# Patient Record
Sex: Female | Born: 1935
Health system: Southern US, Community
[De-identification: ages and names within clinical notes are randomized; demographics above are authoritative.]

## PROBLEM LIST (undated history)

## (undated) DIAGNOSIS — F32A Depression, unspecified: Secondary | ICD-10-CM

## (undated) DIAGNOSIS — C801 Malignant (primary) neoplasm, unspecified: Secondary | ICD-10-CM

## (undated) DIAGNOSIS — F329 Major depressive disorder, single episode, unspecified: Secondary | ICD-10-CM

## (undated) DIAGNOSIS — K219 Gastro-esophageal reflux disease without esophagitis: Secondary | ICD-10-CM

## (undated) DIAGNOSIS — I10 Essential (primary) hypertension: Secondary | ICD-10-CM

## (undated) DIAGNOSIS — E119 Type 2 diabetes mellitus without complications: Secondary | ICD-10-CM

---

## 2014-10-07 ENCOUNTER — Encounter (HOSPITAL_COMMUNITY): Payer: Self-pay | Admitting: Emergency Medicine

## 2014-10-07 ENCOUNTER — Emergency Department (HOSPITAL_COMMUNITY): Payer: 59

## 2014-10-07 ENCOUNTER — Emergency Department (HOSPITAL_COMMUNITY)
Admission: EM | Admit: 2014-10-07 | Discharge: 2014-10-07 | Disposition: A | Discharge status: Home / Self Care | Payer: 59 | Attending: Emergency Medicine | Admitting: Emergency Medicine

## 2014-10-07 DIAGNOSIS — K219 Gastro-esophageal reflux disease without esophagitis: Secondary | ICD-10-CM | POA: Insufficient documentation

## 2014-10-07 DIAGNOSIS — Y998 Other external cause status: Secondary | ICD-10-CM | POA: Diagnosis not present

## 2014-10-07 DIAGNOSIS — W01198A Fall on same level from slipping, tripping and stumbling with subsequent striking against other object, initial encounter: Secondary | ICD-10-CM | POA: Insufficient documentation

## 2014-10-07 DIAGNOSIS — Y92128 Other place in nursing home as the place of occurrence of the external cause: Secondary | ICD-10-CM | POA: Insufficient documentation

## 2014-10-07 DIAGNOSIS — Z859 Personal history of malignant neoplasm, unspecified: Secondary | ICD-10-CM | POA: Diagnosis not present

## 2014-10-07 DIAGNOSIS — S2232XA Fracture of one rib, left side, initial encounter for closed fracture: Secondary | ICD-10-CM | POA: Insufficient documentation

## 2014-10-07 DIAGNOSIS — E119 Type 2 diabetes mellitus without complications: Secondary | ICD-10-CM | POA: Insufficient documentation

## 2014-10-07 DIAGNOSIS — I10 Essential (primary) hypertension: Secondary | ICD-10-CM | POA: Insufficient documentation

## 2014-10-07 DIAGNOSIS — Z79899 Other long term (current) drug therapy: Secondary | ICD-10-CM | POA: Insufficient documentation

## 2014-10-07 DIAGNOSIS — F329 Major depressive disorder, single episode, unspecified: Secondary | ICD-10-CM | POA: Diagnosis not present

## 2014-10-07 DIAGNOSIS — Y9389 Activity, other specified: Secondary | ICD-10-CM | POA: Diagnosis not present

## 2014-10-07 DIAGNOSIS — S299XXA Unspecified injury of thorax, initial encounter: Secondary | ICD-10-CM | POA: Diagnosis present

## 2014-10-07 HISTORY — DX: Major depressive disorder, single episode, unspecified: F32.9

## 2014-10-07 HISTORY — DX: Gastro-esophageal reflux disease without esophagitis: K21.9

## 2014-10-07 HISTORY — DX: Malignant (primary) neoplasm, unspecified: C80.1

## 2014-10-07 HISTORY — DX: Depression, unspecified: F32.A

## 2014-10-07 HISTORY — DX: Type 2 diabetes mellitus without complications: E11.9

## 2014-10-07 HISTORY — DX: Essential (primary) hypertension: I10

## 2014-10-07 LAB — CBG MONITORING, ED: GLUCOSE-CAPILLARY: 119 mg/dL — AB (ref 65–99)

## 2014-10-07 MED ORDER — HYDROCODONE-ACETAMINOPHEN 5-325 MG PO TABS: 1.0000 | ORAL_TABLET | ORAL | Status: AC | PRN

## 2014-10-07 MED ORDER — DOCUSATE SODIUM 100 MG PO CAPS: 100.0000 mg | ORAL_CAPSULE | Freq: Two times a day (BID) | ORAL | Status: AC

## 2014-10-07 MED ORDER — HYDROCODONE-ACETAMINOPHEN 5-325 MG PO TABS
1.0000 | ORAL_TABLET | ORAL | Status: AC
Start: 2014-10-07 — End: 2014-10-07
  Administered 2014-10-07: 1 via ORAL
  Filled 2014-10-07: qty 1

## 2014-10-07 NOTE — Progress Notes (Signed)
CSW met with pt at bedside. There was no family present. Patient is hard of hearing. Patient confirms that he is from [place].   Patient states that she fell yesterday. Patient states that she fell due to trying to pull her pants up. Patient stated " I was reaching to pull my pads up." Patient states that she was using the bathroom.  Patient states that she receives assistance completing  ADL's. Also, pt informed CSW that her sister is her primary support. She states that her sister lives in [place], [place] reached out to sister upon patient's request to inform her that she is at [facility].  Sister/Gray [phone]  Willette Brace [phone] ED CSW 10/07/2014 5:05 PM

## 2014-10-07 NOTE — ED Notes (Signed)
Called report to [place] at the nursing care facility and called [facility] for transport.

## 2014-10-07 NOTE — ED Notes (Signed)
Bed: WA08 Expected date:  Expected time:  Means of arrival:  Comments: Ems- 81 yo fall from wheelchair

## 2014-10-07 NOTE — Discharge Instructions (Signed)

## 2014-10-07 NOTE — ED Provider Notes (Signed)
CSN: [id number]     Arrival date & time 10/07/14  1257 History   First MD Initiated Contact with Patient 10/07/14 1301     Chief Complaint  Patient presents with  . Fall  . Rib Injury    HPI Pt was attempting to stand up when she fell injuring her left ribs.   Pt states this occurred yesterday in the bathroom.  She was attempting to pull her pants up and lost her balance when bending over and hit her ribs on either the wheelchair or a table in the left rib area.  Since then she has had some pain with breathing.  No shortness of breath.  No abdominal pain.  Past Medical History  Diagnosis Date  . Hypertension   . Diabetes mellitus without complication   . GERD (gastroesophageal reflux disease)   . Cancer   . Depression    History reviewed. No pertinent past surgical history. History reviewed. No pertinent family history. History  Substance Use Topics  . Smoking status: Not on file  . Smokeless tobacco: Not on file  . Alcohol Use: Not on file   OB History    No data available     Review of Systems  All other systems reviewed and are negative.     Allergies  Codeine and Sulfa antibiotics  Home Medications   Prior to Admission medications   Medication Sig Start Date End Date Taking? Authorizing Provider  acetaminophen (TYLENOL) 325 MG tablet Take 650 mg by mouth every 4 (four) hours as needed for mild pain or moderate pain.   Yes Historical Provider, MD  alum & mag hydroxide-simeth (MI-ACID) 200-200-20 MG/5ML suspension Take 30 mLs by mouth every 4 (four) hours as needed (constipation *per mar*).   Yes Historical Provider, MD  citalopram (CELEXA) 20 MG tablet Take 20 mg by mouth daily.   Yes Historical Provider, MD  CRANBERRY-VITAMIN C PO Take 1 capsule by mouth 2 (two) times daily.   Yes Historical Provider, MD  esomeprazole (NEXIUM) 40 MG capsule Take 40 mg by mouth daily at 12 noon.   Yes Historical Provider, MD  ferrous sulfate 325 (65 FE) MG tablet Take 325 mg by  mouth daily.   Yes Historical Provider, MD  glipiZIDE (GLUCOTROL XL) 5 MG 24 hr tablet Take 5 mg by mouth daily.   Yes Historical Provider, MD  LORazepam (ATIVAN) 0.5 MG tablet Take 0.5 mg by mouth every 12 (twelve) hours as needed for anxiety.   Yes Historical Provider, MD  magnesium hydroxide (MILK OF MAGNESIA) 400 MG/5ML suspension Take 30 mLs by mouth daily as needed for mild constipation.   Yes Historical Provider, MD  Polyethyl Glycol-Propyl Glycol (LUBRICANT EYE DROPS) 0.4-0.3 % SOLN Apply 2 drops to eye 2 (two) times daily.   Yes Historical Provider, MD  polyethylene glycol (MIRALAX / GLYCOLAX) packet Take 17 g by mouth daily. Mix 1 capful in 8 oz. Of fluid   Yes Historical Provider, MD  primidone (MYSOLINE) 250 MG tablet Take 250 mg by mouth at bedtime.   Yes Historical Provider, MD  psyllium (REGULOID) 0.52 G capsule Take 0.52 g by mouth daily.   Yes Historical Provider, MD  senna-docusate (SENOKOT-S) 8.6-50 MG per tablet Take 2 tablets by mouth at bedtime.   Yes Historical Provider, MD  triamcinolone (KENALOG) 0.025 % cream Apply 1 application topically 2 (two) times daily.   Yes Historical Provider, MD  Vitamin D, Cholecalciferol, 1000 UNITS CAPS Take 2,000 Units by mouth daily.  Yes Historical Provider, MD  docusate sodium (COLACE) 100 MG capsule Take 1 capsule (100 mg total) by mouth every 12 (twelve) hours. 10/07/14   Dorie Rank, MD  HYDROcodone-acetaminophen (NORCO/VICODIN) 5-325 MG per tablet Take 1 tablet by mouth every 4 (four) hours as needed for moderate pain. 10/07/14   Dorie Rank, MD   BP 159/90 mmHg  Pulse 77  Temp(Src) 97.9 F (36.6 C) (Oral)  Resp 18  SpO2 97% Physical Exam  Constitutional: She appears well-developed and well-nourished. No distress.  HENT:  Head: Normocephalic and atraumatic.  Right Ear: External ear normal.  Left Ear: External ear normal.  Eyes: Conjunctivae are normal. Right eye exhibits no discharge. Left eye exhibits no discharge. No scleral  icterus.  Neck: Neck supple. No tracheal deviation present.  Cardiovascular: Normal rate, regular rhythm and intact distal pulses.   Pulmonary/Chest: Effort normal and breath sounds normal. No stridor. No respiratory distress. She has no wheezes. She has no rales. She exhibits tenderness and bony tenderness. She exhibits no crepitus, no edema and no retraction.    Abdominal: Soft. Bowel sounds are normal. She exhibits no distension. There is no tenderness. There is no rebound and no guarding.  Musculoskeletal: She exhibits no edema or tenderness.  Neurological: She is alert. She has normal strength. No cranial nerve deficit (no facial droop, extraocular movements intact, no slurred speech) or sensory deficit. She exhibits normal muscle tone. She displays no seizure activity. Coordination normal.  Skin: Skin is warm and dry. No rash noted.  Psychiatric: She has a normal mood and affect.  Nursing note and vitals reviewed.   ED Course  Procedures (including critical care time) Labs Review Labs Reviewed  CBG MONITORING, ED - Abnormal; Notable for the following:    Glucose-Capillary 119 (*)    All other components within normal limits    Imaging Review Dg Ribs Unilateral W/chest Left  10/07/2014   CLINICAL DATA:  Fall trying to get off the toilet, now with left lower anterior and axillary rib pain.  EXAM: LEFT RIBS AND CHEST - 3+ VIEW  COMPARISON:  None.  FINDINGS: Normal cardiac silhouette and mediastinal contours with atherosclerotic plaque within the thoracic aorta. The lungs appear mildly hyperexpanded. Post bilateral breast augmentation. No discrete focal airspace opacities. No pleural effusion pneumothorax. No evidence of edema.  There is a minimally displaced fracture involving the lateral aspect of the left approximate seventh rib. Regional soft tissues appear normal. No radiopaque foreign body.  IMPRESSION: Suspected minimally displaced fracture involving the lateral aspect of the left  approximate seventh rib without associated pneumothorax, pleural effusion or radiopaque foreign body.   Electronically Signed   By: Sandi Mariscal M.D.   On: 10/07/2014 14:43     MDM   Final diagnoses:  Rib fracture, left, closed, initial encounter    Mechanical fall. Isolated rib fracture.  Pain improved with oral pain medications.  Will dc home with hydrocodone.  Rx colace to help with constipation.  At this time there does not appear to be any evidence of an acute emergency medical condition and the patient appears stable for discharge with appropriate outpatient follow up.     Dorie Rank, MD 10/07/14 1524

## 2014-10-07 NOTE — ED Notes (Addendum)
Pt was pulling up pants and fell back. Hit left ribs on wheelchair on the way down. Per EMS when pt is still she states she's not in pain, only when she moves or takes a deep breath is she in pain. From [place] off [address]

## 2014-10-09 NOTE — ED Notes (Signed)
Opened chart to answer question regarding prescription given

## 2015-08-18 DEATH — deceased

## 2017-02-20 IMAGING — CR DG RIBS W/ CHEST 3+V*L*
5 series · 5 of 5 positions shown · non-contrast
Comparison: None.

CLINICAL DATA: Fall trying to get off the toilet, now with left
lower anterior and axillary rib pain.

EXAM:
LEFT RIBS AND CHEST - 3+ VIEW

[x chest ap]
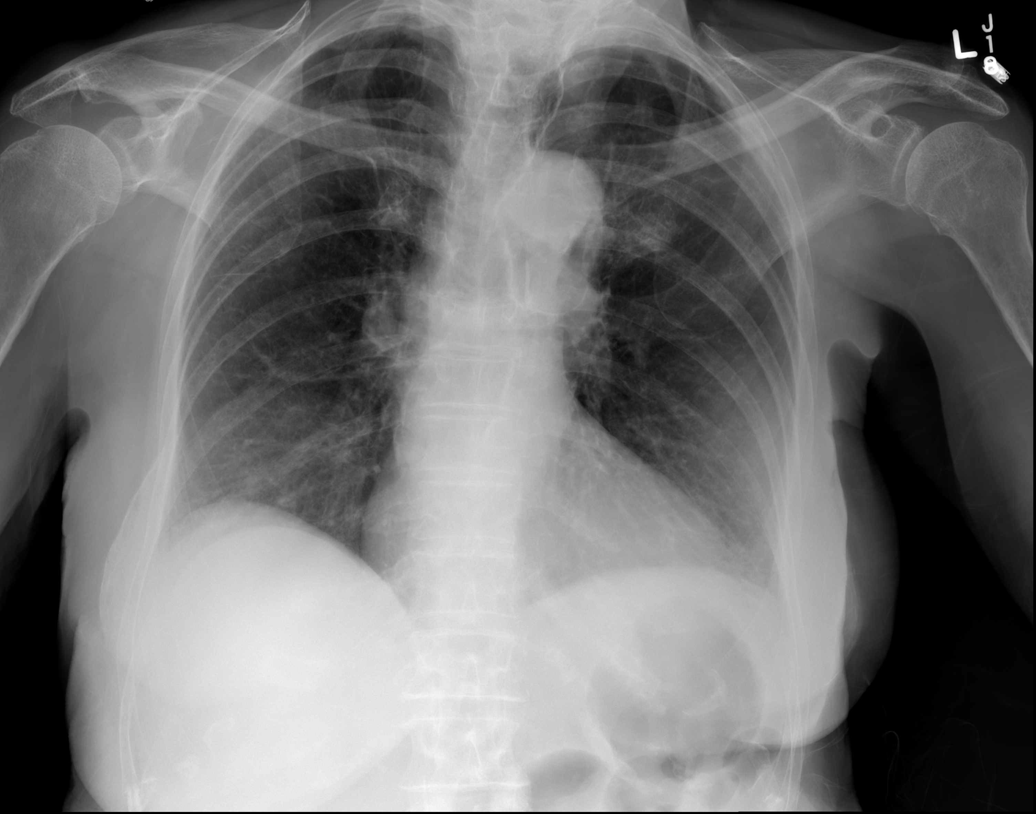

[t ribs ap upper left (1 of 2)]
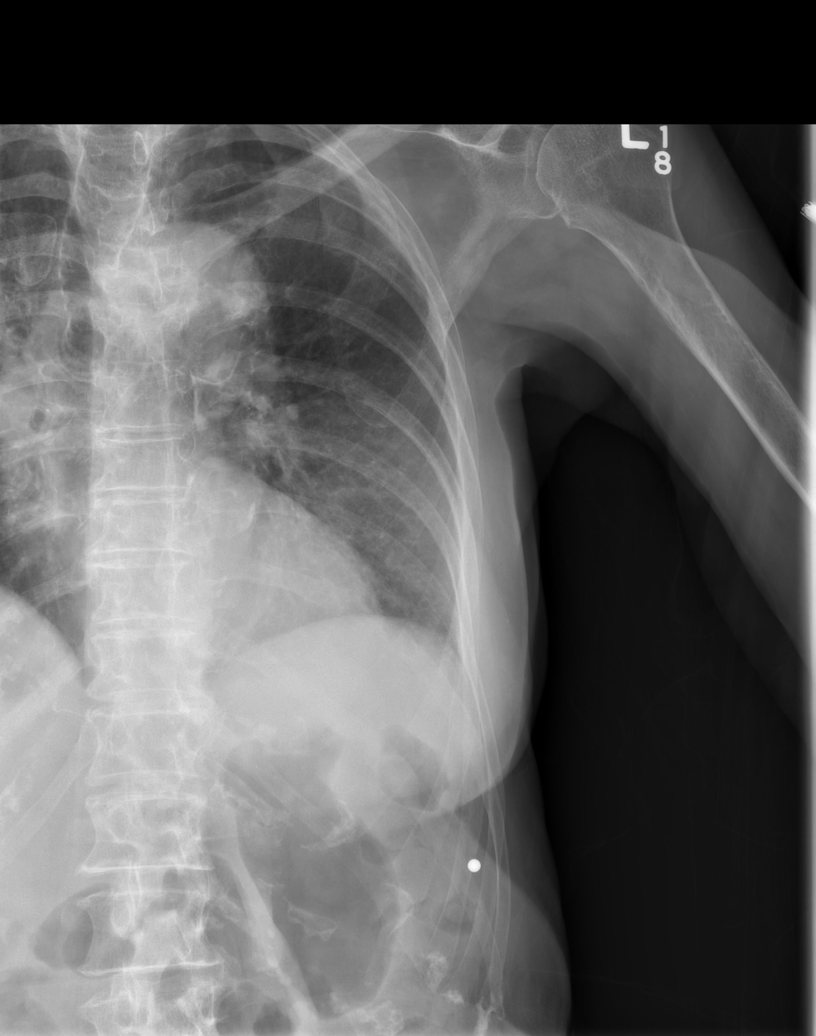

[t ribs ap lower left (1 of 2)]
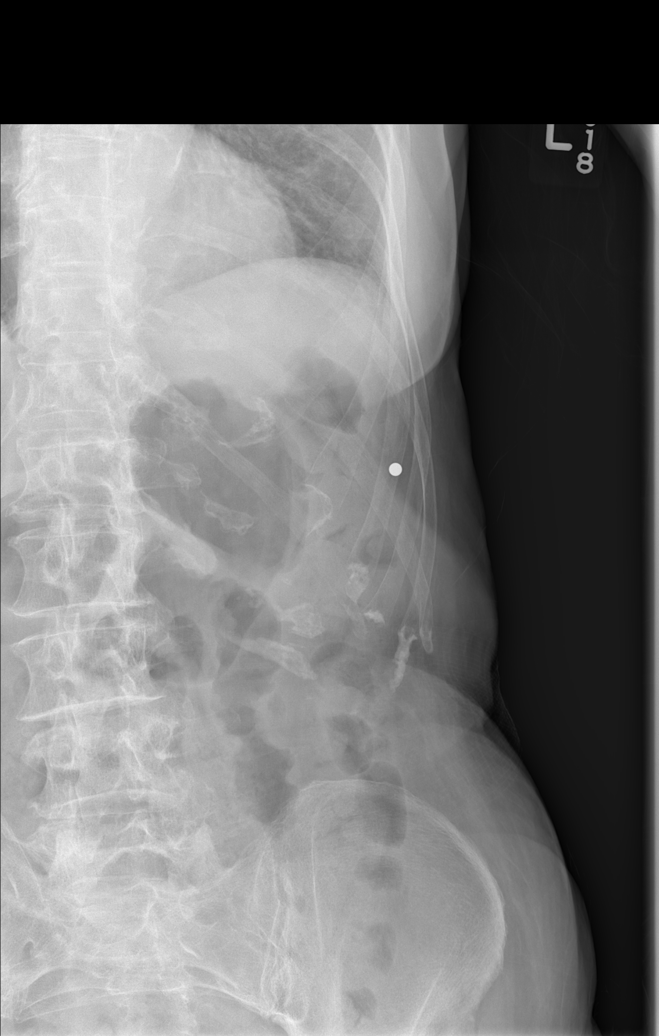

[t ribs ap upper left (2 of 2)]
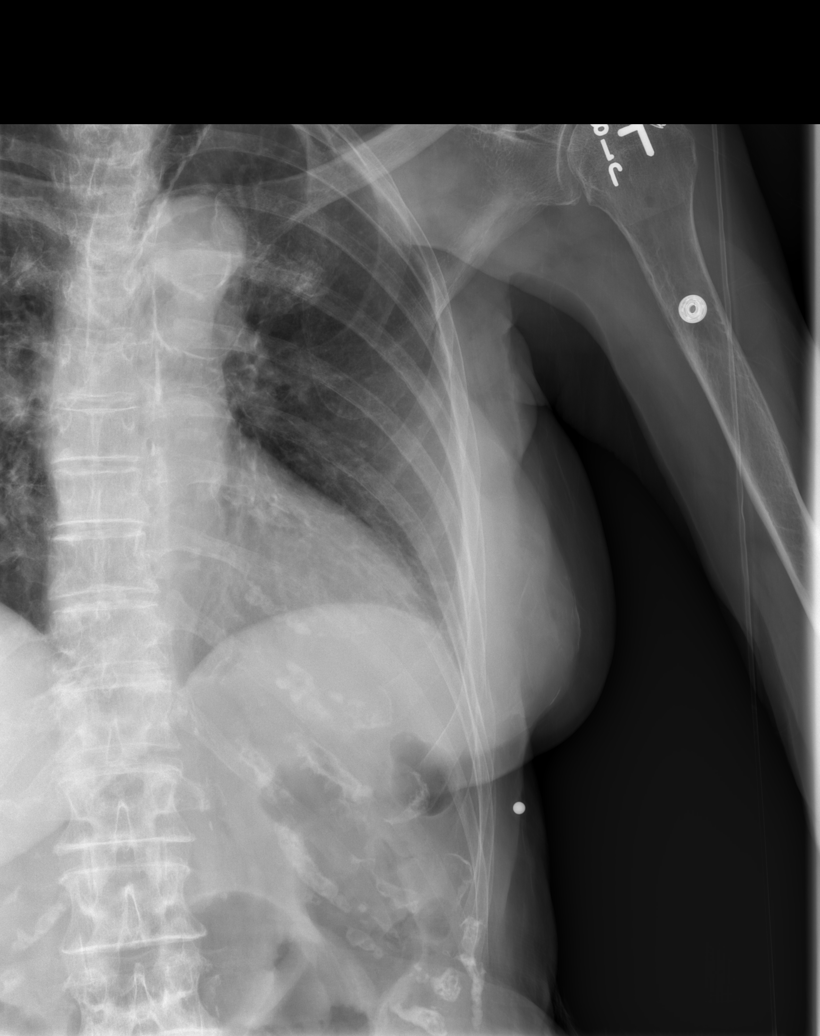

[t ribs ap lower left (2 of 2)]
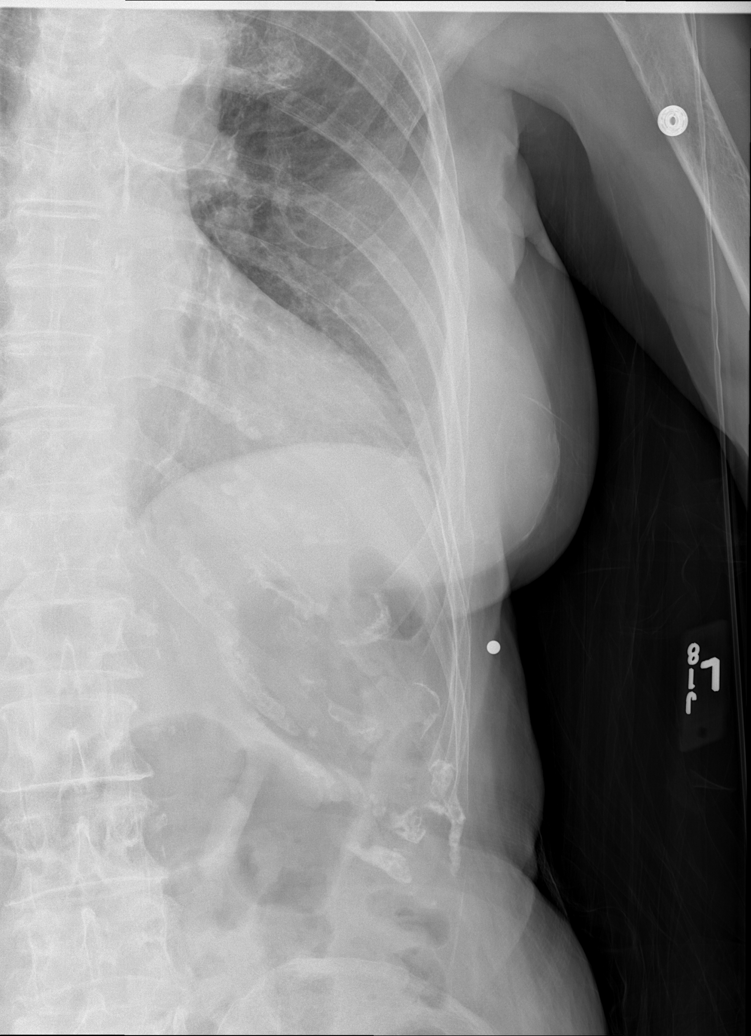

[5 of 5 positions shown; findings below may reference images not displayed]

FINDINGS: Normal cardiac silhouette and mediastinal contours with
atherosclerotic plaque within the thoracic aorta. The lungs appear
mildly hyperexpanded. Post bilateral breast augmentation. No
discrete focal airspace opacities. No pleural effusion pneumothorax.
No evidence of edema.

There is a minimally displaced fracture involving the lateral aspect
of the left approximate seventh rib. Regional soft tissues appear
normal. No radiopaque foreign body.
IMPRESSION: Suspected minimally displaced fracture involving the lateral aspect
of the left approximate seventh rib without associated pneumothorax,
pleural effusion or radiopaque foreign body.
